# Patient Record
Sex: Male | Born: 1965 | Race: White | Hispanic: No | Marital: Married | Smoking: Never smoker
Health system: Southern US, Community
[De-identification: ages and names within clinical notes are randomized; demographics above are authoritative.]

---

## 2016-11-21 ENCOUNTER — Other Ambulatory Visit: Payer: Self-pay | Admitting: Otolaryngology

## 2016-11-21 DIAGNOSIS — H9041 Sensorineural hearing loss, unilateral, right ear, with unrestricted hearing on the contralateral side: Secondary | ICD-10-CM

## 2016-11-26 ENCOUNTER — Ambulatory Visit
Admission: RE | Admit: 2016-11-26 | Discharge: 2016-11-26 | Disposition: A | Discharge status: Home / Self Care | Payer: BLUE CROSS/BLUE SHIELD | Source: Ambulatory Visit | Attending: Otolaryngology | Admitting: Otolaryngology

## 2016-11-26 DIAGNOSIS — H9041 Sensorineural hearing loss, unilateral, right ear, with unrestricted hearing on the contralateral side: Secondary | ICD-10-CM

## 2016-11-26 MED ORDER — GADOBENATE DIMEGLUMINE 529 MG/ML IV SOLN
20.0000 mL | Freq: Once | INTRAVENOUS | Status: AC | PRN
  Administered 2016-11-26: 20 mL via INTRAVENOUS

## 2017-12-27 IMAGING — MR MR BRAIN/IAC WO/W
15 of 17 series · 40 of 48 positions shown · IV contrast (20 ML multihance)
Comparison: None available.

CLINICAL DATA: Initial evaluation for right-sided hearing loss.

EXAM:
MRI HEAD WITHOUT AND WITH CONTRAST
TECHNIQUE: Multiplanar, multiecho pulse sequences of the brain and surrounding
structures were obtained without and with intravenous contrast. An
IAC protocol was employed.
CONTRAST:  20mL MULTIHANCE GADOBENATE DIMEGLUMINE 529 MG/ML IV SOLN

[Series 2: T1 · sagittal · 5.0mm · 0.47mm/px · 1 of 21 slices shown (1 of 3)]
[im 1/21]
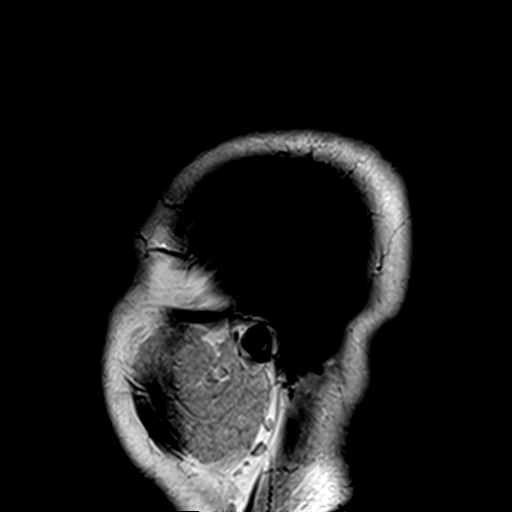

[Series 3: DWI · axial · 3.0mm · 1.80mm/px · z∈[-41,+111]mm · 7 of 104 slices shown (1 of 2)]
[im 1/104]
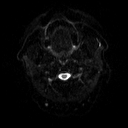
[im 18/104]
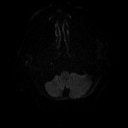
[im 35/104]
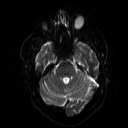
[im 52/104]
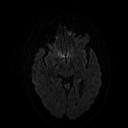
[im 69/104]
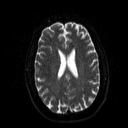
[im 86/104]
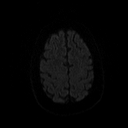
[im 104/104]
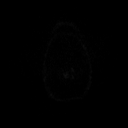

[Series 4: DWI · axial · 3.0mm · 1.80mm/px · z∈[-41,+111]mm · 3 of 52 slices shown (2 of 2)]
[im 1/52]
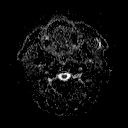
[im 26/52]
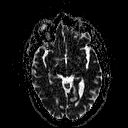
[im 52/52]
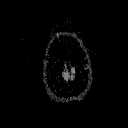

[Series 5: T2 · axial · 5.0mm · 0.51mm/px · z∈[-46,+115]mm · 2 of 25 slices shown (1 of 2)]
[im 1/25]
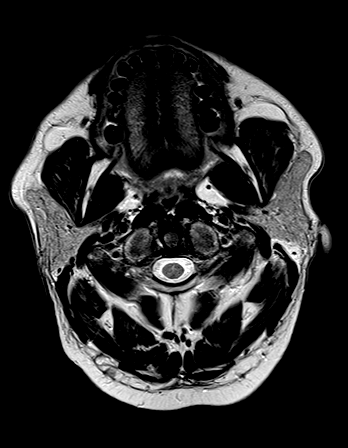
[im 25/25]
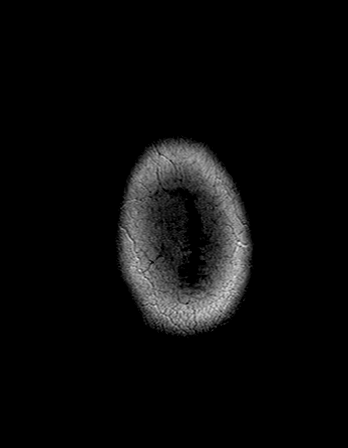

[Series 6: FLAIR · axial · 3.0mm · 0.45mm/px · z∈[-39,+109]mm · 2 of 33 slices shown]
[im 1/33]
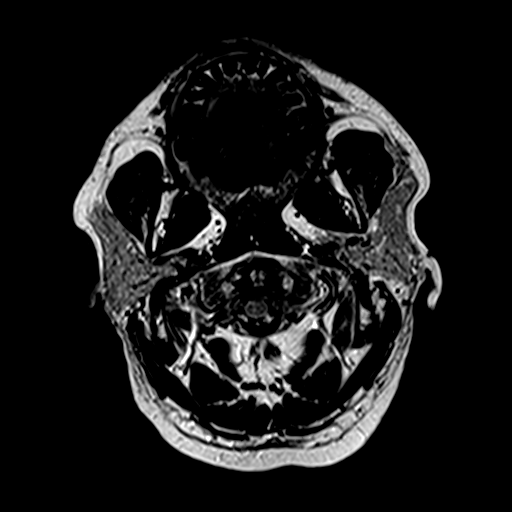
[im 33/33]
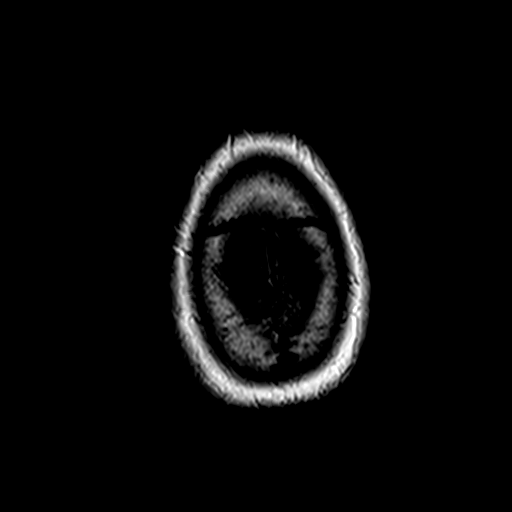

[Series 7: mip_images(sw) · axial · 40.0mm · 0.90mm/px · 1 of 23 slices shown]
[im 1/23]
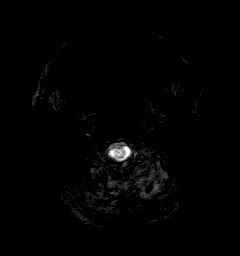

[Series 8: swi_images · axial · 5.0mm · 0.90mm/px · z∈[-37,+107]mm · 2 of 30 slices shown]
[im 1/30]
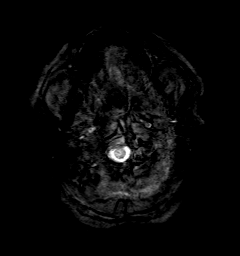
[im 30/30]
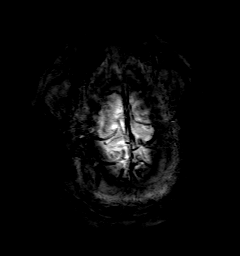

[Series 9: t1_mpr_tra · axial · 1.0mm · 0.75mm/px · z∈[-36,+106]mm · 8 of 144 slices shown (1 of 2)]
[im 1/144]
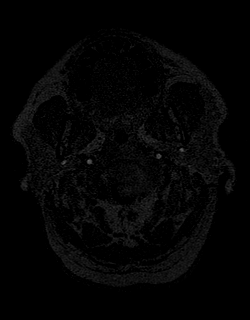
[im 18/144]
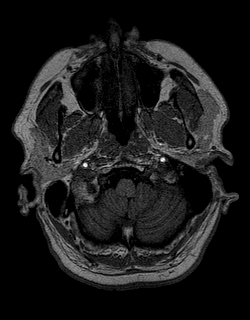
[im 36/144]
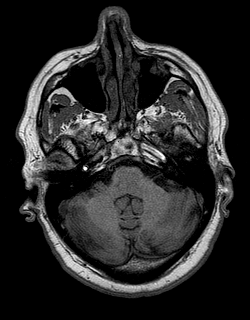
[im 54/144]
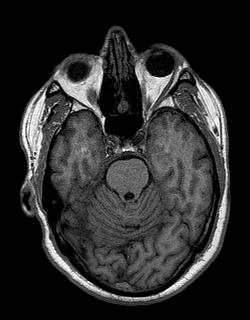
[im 90/144]
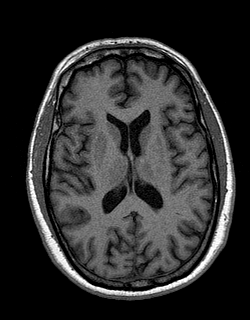
[im 108/144]
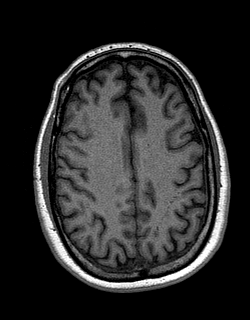
[im 126/144]
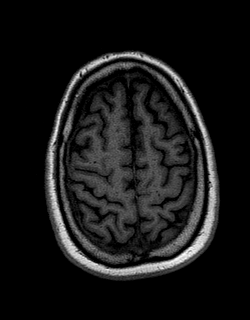
[im 144/144]
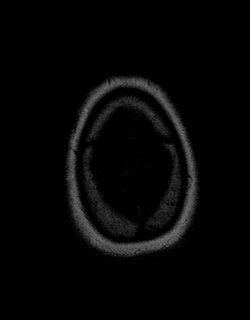

[Series 10: T1 · coronal · 3.0mm · 0.35mm/px · 1 of 14 slices shown (2 of 3)]
[im 1/14]
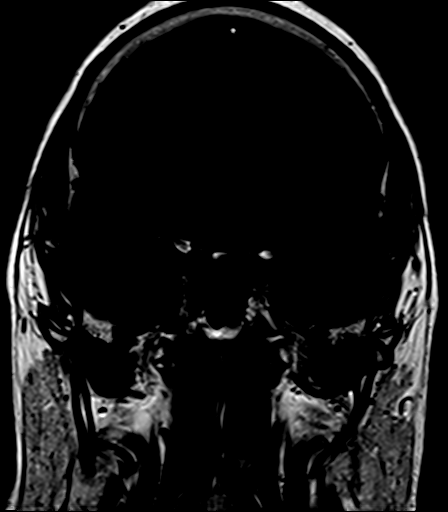

[Series 11: T1 · axial · 3.0mm · 0.35mm/px · 1 of 14 slices shown (3 of 3)]
[im 1/14]
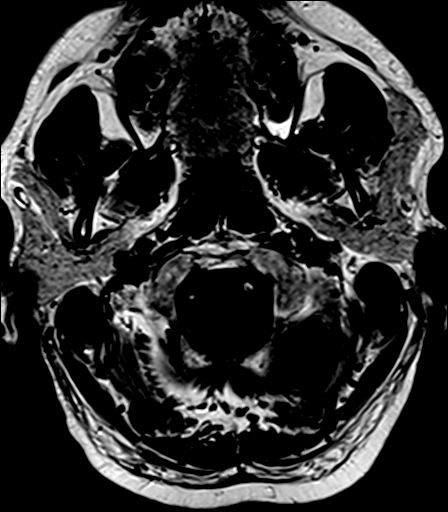

[Series 12: bSSFP · axial · 1.0mm · 0.28mm/px · z∈[-27,+12]mm · 3 of 40 slices shown]
[im 1/40]
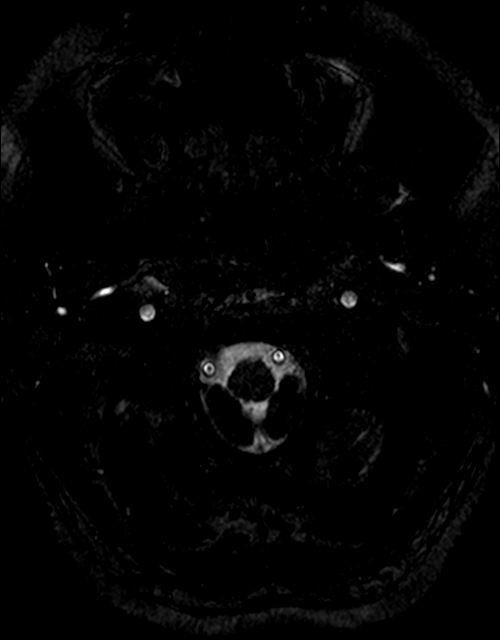
[im 20/40]
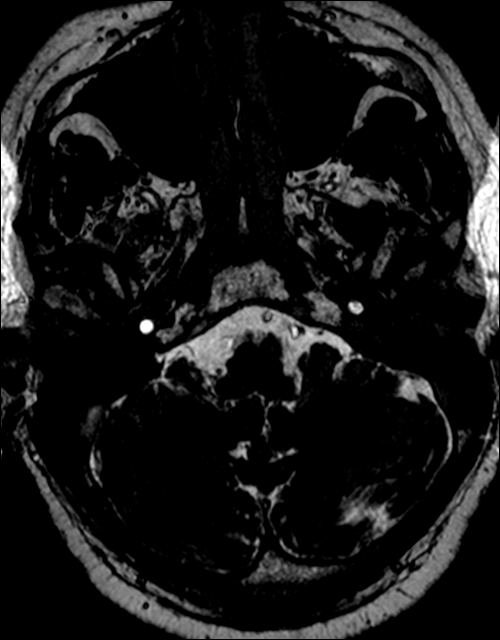
[im 40/40]
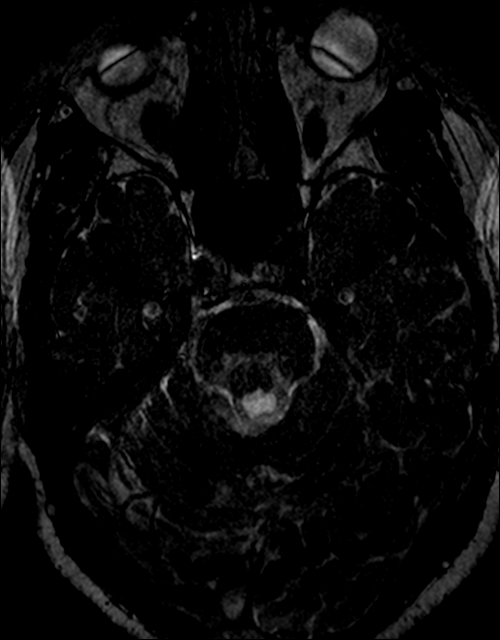

[Series 13: T2 · coronal · 5.0mm · 0.45mm/px · 2 of 25 slices shown (2 of 2)]
[im 1/25]
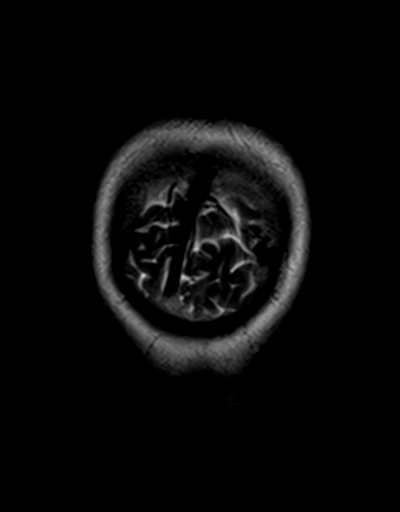
[im 25/25]
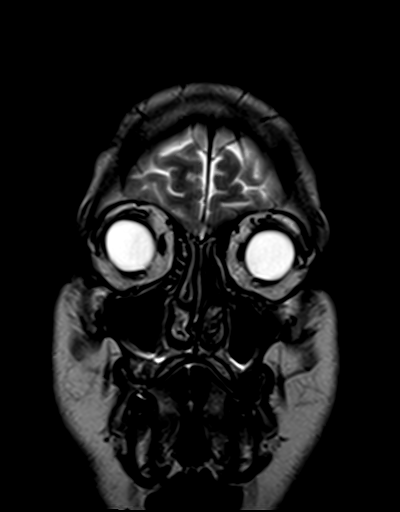

[Series 14: T1 post-contrast · coronal · 3.0mm · 0.35mm/px · 1 of 14 slices shown (1 of 2)]
[im 1/14]
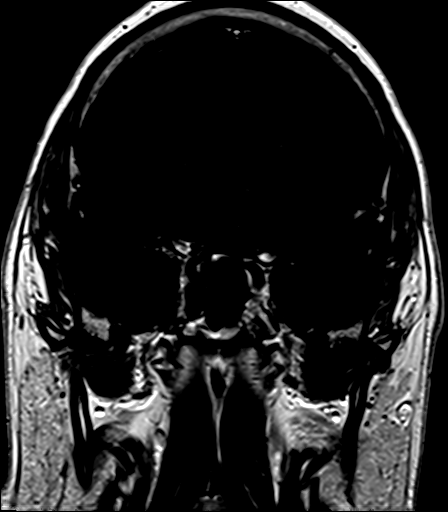

[Series 15: T1 post-contrast · axial · 3.0mm · 0.35mm/px · 1 of 14 slices shown (2 of 2)]
[im 1/14]
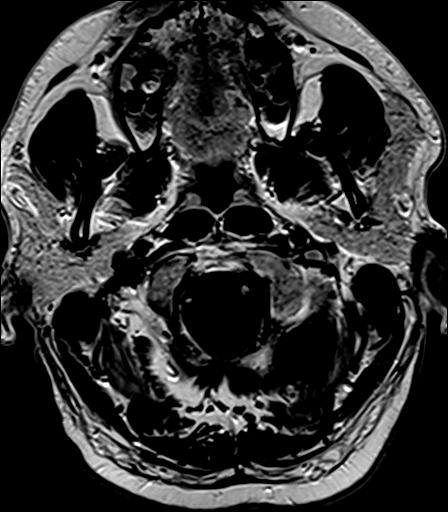

[Series 16: t1_mpr_tra · axial · 1.0mm · 0.75mm/px · z∈[-36,+52]mm · 5 of 144 slices shown (2 of 2)]
[im 1/144]
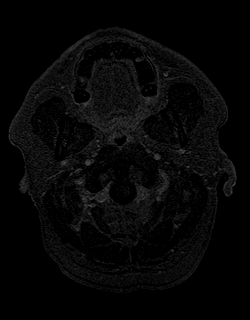
[im 18/144]
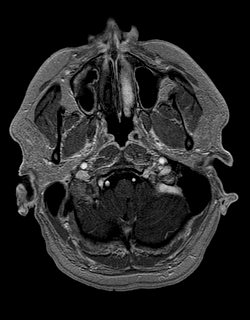
[im 36/144]
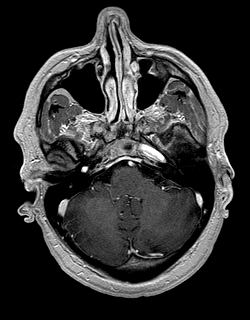
[im 54/144]
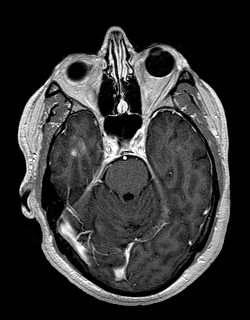
[im 90/144]
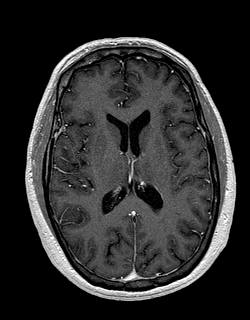

[40 of 48 positions shown; findings below may reference images not displayed]

FINDINGS: Brain: Cerebral volume within normal limits for age. No focal
parenchymal signal abnormality identified. No significant cerebral
white matter disease for age.

No abnormal foci of restricted diffusion to suggest acute or
subacute ischemia. Gray-white matter differentiation well
maintained. No evidence for chronic infarction. No acute or chronic
intracranial hemorrhage.

No mass lesion, midline shift or mass effect. Ventricles normal in
size without evidence for hydrocephalus. No extra-axial fluid
collection. Major dural sinuses are patent.

Pituitary gland and suprasellar region within normal limits. Midline
structures intact and normal.

Thin section imaging through the internal auditory canals was
performed. No mass lesions seen at the cerebellopontine angles.
Seventh and eighth cranial nerves seen coursing normally into the
internal auditory canals. Inner ear structures including the
vestibules, cochlea, and semi circular canals are normal. Normal
post ganglionic enhancement seen within the seventh cranial nerves
bilaterally. No mass lesion or abnormal enhancement identified. No
mastoid effusion.

Vascular: Major intracranial vascular flow voids are well maintained
and are normal.

Skull and upper cervical spine: Craniocervical junction within
normal limits. Visualized upper cervical spine unremarkable. Bone
marrow signal intensity within normal limits. No scalp soft tissue
abnormality.

Sinuses/Orbits: Globes orbital soft tissues within normal limits.
Mild scattered mucosal thickening within the left sphenoid sinus and
maxillary sinuses. Paranasal sinuses are otherwise clear.
IMPRESSION: Normal IAC protocol MRI of the brain. No structural findings to
explain patient's symptoms identified.

## 2019-11-29 ENCOUNTER — Other Ambulatory Visit (INDEPENDENT_AMBULATORY_CARE_PROVIDER_SITE_OTHER): Payer: Self-pay | Admitting: Otolaryngology

## 2020-05-19 ENCOUNTER — Ambulatory Visit (INDEPENDENT_AMBULATORY_CARE_PROVIDER_SITE_OTHER): Payer: BC Managed Care – PPO | Admitting: Otolaryngology

## 2020-05-19 ENCOUNTER — Encounter (INDEPENDENT_AMBULATORY_CARE_PROVIDER_SITE_OTHER): Payer: Self-pay | Admitting: Otolaryngology

## 2020-05-19 ENCOUNTER — Other Ambulatory Visit: Payer: Self-pay

## 2020-05-19 VITALS — Temp 97.3°F

## 2020-05-19 DIAGNOSIS — H8101 Meniere's disease, right ear: Secondary | ICD-10-CM

## 2020-05-19 NOTE — Progress Notes (Signed)
HPI: Todd Boyd is a 55 y.o. 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  This is fairly well controlled with low-salt diet and diuretic.  He tried coming off the diuretic but the dizzy episodes became little bit worse and he is back on the diuretic.  He uses meclizine when he has the dizzy spells.  He has noticed a little more decline in hearing in his right ear and has an aide for the right ear but apparently this was damaged by their dog and he is looking to get a new hearing aid.  He needs refills of his Dyazide and meclizine..  No past medical history on file.  Social History   Socioeconomic History  . Marital status: Married    Spouse name: Not on file  . Number of children: Not on file  . Years of education: Not on file  . Highest education level: Not on file  Occupational History  . Not on file  Tobacco Use  . Smoking status: Never Smoker  . Smokeless tobacco: Former Neurosurgeon  . Tobacco comment: chewed off and on for 3 to 4 years  Substance and Sexual Activity  . Alcohol use: Not on file  . Drug use: Not on file  . Sexual activity: Not on file  Other Topics Concern  . Not on file  Social History Narrative  . Not on file   Social Determinants of Health   Financial Resource Strain: Not on file  Food Insecurity: Not on file  Transportation Needs: Not on file  Physical Activity: Not on file  Stress: Not on file  Social Connections: Not on file   No family history on file. No Known Allergies Prior to Admission medications   Medication Sig Start Date End Date Taking? Authorizing Provider  triamterene-hydrochlorothiazide (MAXZIDE-25) 37.5-25 MG tablet TAKE 2 TABLETS BY MOUTH EVERY DAY 03/16/20   Drema Halon, MD     Positive ROS: Otherwise negative  All other systems have been reviewed and were otherwise negative with the exception of those mentioned in the HPI and as above.  Physical Exam: Constitutional: Alert, well-appearing,  no acute distress Ears: External ears without lesions or tenderness. Ear canals are clear bilaterally with intact, clear TMs bilaterally. Nasal: External nose without lesions. Clear nasal passages Oral: Lips and gums without lesions. Tongue and palate mucosa without lesions. Posterior oropharynx clear. Neck: No palpable adenopathy or masses Respiratory: Breathing comfortably  Skin: No facial/neck lesions or rash noted.  Procedures  Assessment: History of right-sided Mnire's disease with hearing loss.  His dizzy episodes are doing better.  Plan: Refilled his Dyazide 1 daily a.m. for 90 and 4 refills. Also refilled his meclizine 25 mg 1 every 8 hours as needed dizzy #40 with 1 refill. He will follow-up in 1 year for recheck.  Earlier if he has any problems.   Narda Bonds, MD

## 2020-11-24 ENCOUNTER — Other Ambulatory Visit (INDEPENDENT_AMBULATORY_CARE_PROVIDER_SITE_OTHER): Payer: Self-pay | Admitting: Otolaryngology

## 2020-11-24 ENCOUNTER — Telehealth (INDEPENDENT_AMBULATORY_CARE_PROVIDER_SITE_OTHER): Payer: Self-pay | Admitting: Otolaryngology

## 2020-11-24 MED ORDER — TRIAMTERENE-HCTZ 37.5-25 MG PO CAPS: 1.0000 | ORAL_CAPSULE | Freq: Every day | ORAL | 3 refills | Status: AC

## 2020-11-24 NOTE — Telephone Encounter (Signed)
Patient called about refill of his Dyazide called into [facility].

## 2023-05-16 ENCOUNTER — Encounter: Payer: Self-pay | Admitting: Family Medicine

## 2023-05-16 ENCOUNTER — Ambulatory Visit: Payer: BC Managed Care – PPO | Admitting: Family Medicine

## 2023-05-16 VITALS — BP 144/72 | HR 80 | Temp 99.1°F | Ht 71.0 in | Wt 199.0 lb

## 2023-05-16 DIAGNOSIS — Z125 Encounter for screening for malignant neoplasm of prostate: Secondary | ICD-10-CM

## 2023-05-16 DIAGNOSIS — Z1159 Encounter for screening for other viral diseases: Secondary | ICD-10-CM

## 2023-05-16 DIAGNOSIS — M1A09X0 Idiopathic chronic gout, multiple sites, without tophus (tophi): Secondary | ICD-10-CM | POA: Diagnosis not present

## 2023-05-16 DIAGNOSIS — R0683 Snoring: Secondary | ICD-10-CM | POA: Diagnosis not present

## 2023-05-16 DIAGNOSIS — H8101 Meniere's disease, right ear: Secondary | ICD-10-CM

## 2023-05-16 DIAGNOSIS — E663 Overweight: Secondary | ICD-10-CM

## 2023-05-16 DIAGNOSIS — Z7689 Persons encountering health services in other specified circumstances: Secondary | ICD-10-CM

## 2023-05-16 DIAGNOSIS — R030 Elevated blood-pressure reading, without diagnosis of hypertension: Secondary | ICD-10-CM | POA: Insufficient documentation

## 2023-05-16 DIAGNOSIS — Z1211 Encounter for screening for malignant neoplasm of colon: Secondary | ICD-10-CM

## 2023-05-16 DIAGNOSIS — Z1212 Encounter for screening for malignant neoplasm of rectum: Secondary | ICD-10-CM

## 2023-05-16 HISTORY — DX: Idiopathic chronic gout, multiple sites, without tophus (tophi): M1A.09X0

## 2023-05-16 HISTORY — DX: Meniere's disease, right ear: H81.01

## 2023-05-16 MED ORDER — COLCHICINE 0.6 MG PO TABS: ORAL_TABLET | ORAL | 0 refills | Status: AC

## 2023-05-16 NOTE — Assessment & Plan Note (Signed)
 1-2 gout flares a year.  Uric acid level ordered today.  Colchicine prescription provided.

## 2023-05-16 NOTE — Patient Instructions (Signed)
 It was nice to see you today,  We addressed the following topics today: -Your blood pressure was a little bit high again.  I would like you to check it once or twice a day for a few weeks and document these values.  When I see you again in 1 month we can discuss your home readings and compare them to what we get in the office. - I have ordered some blood tests and we will follow-up with results with you when I get them. - I have sent in a order for the Cologuard.  Someone will mail it to you.  Instructions on what to do and how to mail back will be included on the box - I have provided you a prescription for colchicine .  Take this as needed for gout flares.  Have a great day,  Rolan Slain, MD

## 2023-05-16 NOTE — Assessment & Plan Note (Signed)
 Sees ENT.  Takes triamterene-HCTZ.  Has some hearing loss due to this, more so in the right ear.

## 2023-05-16 NOTE — Progress Notes (Signed)
 New Patient Office Visit  Subjective   Patient ID: Todd Boyd, male    DOB: November 01, 1965  Age: 58 y.o. MRN: [id number]  CC:  Chief Complaint  Patient presents with   Establish Care    HPI Todd Boyd presents to establish care  Patient did not have a primary care provider prior to this.  He has been seeing an ear nose and throat doctor for Mnire's disease.  Patient takes triamterene -HCTZ.  Also takes over-the-counter Benadryl every night for allergies.  Will occasionally get gout flares once or twice a year and usually takes a friend's colchicine   Patient also complains of sleep apnea.  States he has been told he has apneic episodes and snoring.  Does not have mid day fatigue, no headaches.  Does have elevated blood pressure.  Patient agreeable to doing the Cologuard for colon cancer screening.  PMH: Mnire's-right-sided.  Gout, allergies  PSH: No  FH: No  Tobacco use: dipped for 5 years.  Stopped a few months ago.   Alcohol use: socially.   Drug use: no Marital status: married.  Daughter.  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    Screenings:  Colon Cancer: Never done.   Prostate cancer: Needs PSA   Outpatient Encounter Medications as of 05/16/2023  Medication Sig   colchicine  0.6 MG tablet Take 2 tabs (1.2 mg) by mouth, then 0.6 mg 1 hr later on first day. Starting day 2, take 1 tablet (0.6 mg total) by mouth once or twice daily until gout flare resolves.   triamterene -hydrochlorothiazide (DYAZIDE) 37.5-25 MG capsule Take 1 each (1 capsule total) by mouth daily.   triamterene -hydrochlorothiazide (MAXZIDE-25) 37.5-25 MG tablet TAKE 2 TABLETS BY MOUTH EVERY DAY (Patient not taking: Reported on 05/16/2023)   No facility-administered encounter medications on file as of 05/16/2023.    Past Medical History:  Diagnosis Date   Idiopathic chronic gout of multiple sites without tophus 05/16/2023   Meniere's disease of right ear 05/16/2023     History reviewed. No pertinent surgical history.  History reviewed. No pertinent family history.  Social History   Socioeconomic History   Marital status: Married    Spouse name: Not on file   Number of children: Not on file   Years of education: Not on file   Highest education level: Not on file  Occupational History   Not on file  Tobacco Use   Smoking status: Never   Smokeless tobacco: Former   Tobacco comments:    chewed off and on for 3 to 4 years  Substance and Sexual Activity   Alcohol use: Yes    Alcohol/week: 1.0 standard drink of alcohol    Types: 1 Standard drinks or equivalent per week   Drug use: Never   Sexual activity: Not on file  Other Topics Concern   Not on file  Social History Narrative   Not on file   Social Drivers of Health   Financial Resource Strain: Not on file  Food Insecurity: Not on file  Transportation Needs: Not on file  Physical Activity: Not on file  Stress: Not on file  Social Connections: Not on file  Intimate Partner Violence: Not on file    ROS     Objective   BP (!) 144/72   Pulse 80   Temp 99.1 F (37.3 C) (Oral)   Ht 5' 11 (1.803 m)   Wt 199 lb (90.3 kg)   SpO2 100%   BMI  27.75 kg/m   Physical Exam General: Alert, oriented HEENT: PERRLA, EOMI, moist mucosa CV: Regular rate and rhythm Pulmonary: Lungs clear bilaterally GI: Soft, nontender.  Normal bowel sounds MSK: Strength equal bilaterally, normal gait Extremities: No pedal edema Psych: Pleasant affect     Assessment & Plan:   Encounter to establish care  Idiopathic chronic gout of multiple sites without tophus Assessment & Plan: 1-2 gout flares a year.  Uric acid level ordered today.  Colchicine  prescription provided.  Orders: -     CBC -     Uric acid  Meniere's disease of right ear Assessment & Plan: Sees ENT.  Takes triamterene -HCTZ.  Has some hearing loss due to this, more so in the right ear.  Orders: -     Comprehensive  metabolic panel  Encounter for colorectal cancer screening -     Cologuard  Need for hepatitis C screening test -     Hepatitis C antibody  Prostate cancer screening -     PSA  Overweight (BMI 25.0-29.9) -     Hemoglobin A1c -     Lipid panel  Snoring Assessment & Plan: Complains of snoring and apneic episodes.  Has hypertension.  STOP-BANG score 5, high risk.  Sleep study ordered.  Orders: -     PSG Sleep Study; Future  Elevated blood pressure reading Assessment & Plan: Elevated on 2 readings today.  He is on blood pressure medication for Mnire's.  Advised to check home readings and follow-up in 1 month.  If still elevated or if home readings are elevated can discuss additional medications.   Other orders -     Colchicine ; Take 2 tabs (1.2 mg) by mouth, then 0.6 mg 1 hr later on first day. Starting day 2, take 1 tablet (0.6 mg total) by mouth once or twice daily until gout flare resolves.  Dispense: 30 tablet; Refill: 0    Return in about 4 weeks (around 06/13/2023) for HTN.   [name], MD

## 2023-05-16 NOTE — Assessment & Plan Note (Signed)
 Complains of snoring and apneic episodes.  Has hypertension.  STOP-BANG score 5, high risk.  Sleep study ordered.

## 2023-05-16 NOTE — Assessment & Plan Note (Signed)
 Elevated on 2 readings today.  He is on blood pressure medication for Mnire's.  Advised to check home readings and follow-up in 1 month.  If still elevated or if home readings are elevated can discuss additional medications.

## 2023-05-18 LAB — CBC
Hematocrit: 54.3 % — ABNORMAL HIGH (ref 37.5–51.0)
Hemoglobin: 18.2 g/dL — ABNORMAL HIGH (ref 13.0–17.7)
MCH: 31.1 pg (ref 26.6–33.0)
MCHC: 33.5 g/dL (ref 31.5–35.7)
MCV: 93 fL (ref 79–97)
Platelets: 341 10*3/uL (ref 150–450)
RBC: 5.85 x10E6/uL — ABNORMAL HIGH (ref 4.14–5.80)
RDW: 12.5 % (ref 11.6–15.4)
WBC: 7.2 10*3/uL (ref 3.4–10.8)

## 2023-05-18 LAB — LIPID PANEL
Chol/HDL Ratio: 6.2 {ratio} — ABNORMAL HIGH (ref 0.0–5.0)
Cholesterol, Total: 296 mg/dL — ABNORMAL HIGH (ref 100–199)
HDL: 48 mg/dL (ref >39)
LDL Chol Calc (NIH): 199 mg/dL — ABNORMAL HIGH (ref 0–99)
Triglycerides: 248 mg/dL — ABNORMAL HIGH (ref 0–149)
VLDL Cholesterol Cal: 49 mg/dL — ABNORMAL HIGH (ref 5–40)

## 2023-05-18 LAB — COMPREHENSIVE METABOLIC PANEL
ALT: 71 [IU]/L — ABNORMAL HIGH (ref 0–44)
AST: 66 [IU]/L — ABNORMAL HIGH (ref 0–40)
Albumin: 5.1 g/dL — ABNORMAL HIGH (ref 3.8–4.9)
Alkaline Phosphatase: 110 [IU]/L (ref 44–121)
BUN/Creatinine Ratio: 10 (ref 9–20)
BUN: 12 mg/dL (ref 6–24)
Bilirubin Total: 0.8 mg/dL (ref 0.0–1.2)
CO2: 22 mmol/L (ref 20–29)
Calcium: 11 mg/dL — ABNORMAL HIGH (ref 8.7–10.2)
Chloride: 95 mmol/L — ABNORMAL LOW (ref 96–106)
Creatinine, Ser: 1.17 mg/dL (ref 0.76–1.27)
Globulin, Total: 3.5 g/dL (ref 1.5–4.5)
Glucose: 121 mg/dL — ABNORMAL HIGH (ref 70–99)
Potassium: 3.8 mmol/L (ref 3.5–5.2)
Sodium: 139 mmol/L (ref 134–144)
Total Protein: 8.6 g/dL — ABNORMAL HIGH (ref 6.0–8.5)
eGFR: 72 mL/min/{1.73_m2} (ref >59)

## 2023-05-18 LAB — HEMOGLOBIN A1C
Est. average glucose Bld gHb Est-mCnc: 134 mg/dL
Hgb A1c MFr Bld: 6.3 % — ABNORMAL HIGH (ref 4.8–5.6)

## 2023-05-18 LAB — URIC ACID: Uric Acid: 7.4 mg/dL (ref 3.8–8.4)

## 2023-05-18 LAB — PSA: Prostate Specific Ag, Serum: 0.9 ng/mL (ref 0.0–4.0)

## 2023-05-18 LAB — HEPATITIS C ANTIBODY

## 2023-05-20 ENCOUNTER — Telehealth: Payer: Self-pay

## 2023-05-20 NOTE — Telephone Encounter (Signed)
 Please give this information and schedule accordingly:  Hemoglobin elevated, possibly due to sleep apnea.  Calcium elevated.  AST and ALT elevated.  A1c in the high prediabetic range.  LDL cholesterol greater than 190, total cholesterol 296.   Please call the patient to tell him that his cholesterol level is significantly elevated, to the point that it is recommended that he start a statin medication.  His A1c showed that he is prediabetic.  He has several other abnormal lab findings that I would like to discussed with him at the office.  Please have him schedule a follow-up appointment with me in the next 3 to 4 weeks.  He was supposed to schedule a follow-up appointment a month from now for blood pressure recheck as well.

## 2023-07-15 LAB — COLOGUARD: COLOGUARD: NEGATIVE

## 2023-07-16 ENCOUNTER — Telehealth: Payer: Self-pay

## 2023-07-16 NOTE — Telephone Encounter (Signed)
 LVM for pt to return call for results and to schedule a follow up appt.

## 2023-07-16 NOTE — Telephone Encounter (Signed)
-----   Message from Sandre Kitty sent at 07/15/2023  5:39 PM EDT ----- Cologuard was negative.    Please attempt to call the patient again to inform him of his results including the previous lab results and to have him schedule a follow-up visit with me.

## 2023-08-05 ENCOUNTER — Ambulatory Visit (HOSPITAL_BASED_OUTPATIENT_CLINIC_OR_DEPARTMENT_OTHER): Payer: BC Managed Care – PPO | Attending: Family Medicine | Admitting: Internal Medicine

## 2023-08-05 DIAGNOSIS — G4733 Obstructive sleep apnea (adult) (pediatric): Secondary | ICD-10-CM | POA: Insufficient documentation

## 2023-08-05 DIAGNOSIS — R0683 Snoring: Secondary | ICD-10-CM | POA: Diagnosis present

## 2023-08-11 DIAGNOSIS — R0683 Snoring: Secondary | ICD-10-CM | POA: Diagnosis not present

## 2023-08-11 NOTE — Procedures (Signed)
 [facility] [address] [place], [place] [address] Tel: [phone]   Fax: [phone]  Polysomnography Interpretation  Patient Name:  Todd Boyd, Todd Boyd Date:  08/05/2023 Referring Physician:  Dr. Lenor Coffin  Indications for Polysomnography The patient is a 58 year old Male who is 5\' 10"  and weighs 182.0 lbs. His BMI equals 26.4.  A full night polysomnogram was performed to evaluate for -.  Medication were taken at 10:00 pm.  Benadryl   Polysomnogram Data A full night polysomnogram recorded the standard physiologic parameters including EEG, EOG, EMG, EKG, nasal and oral airflow.  Respiratory parameters of chest and abdominal movements were recorded with Respiratory Inductance Plethysmography belts.  Oxygen saturation was recorded by pulse oximetry.   Sleep Architecture The total recording time of the polysomnogram was 396.9 minutes.  The total sleep time was 354.5 minutes.  The patient spent 21.0% of total sleep time in Stage N1, 61.4% in Stage N2, 0.0% in Stages N3, and 17.6% in REM.  Sleep latency was 6.5 minutes.  REM latency was 110.5 minutes.  Sleep Efficiency was 89.3%.  Wake after Sleep Onset time was 35.5 minutes.  Respiratory Events The polysomnogram revealed a presence of 49 obstructive, 4 centrals, and - mixed apneas resulting in an Apnea index of 9.0 events per hour.  There were 110 hypopneas (>=3% desaturation and/or arousal) resulting in an Apnea\Hypopnea Index (AHI >=3% desaturation and/or arousal) of 27.6 events per hour.  There were 79 hypopneas (>=4% desaturation) resulting in an Apnea\Hypopnea Index (AHI >=4% desaturation) of 22.3 events per hour.  There were 39 Respiratory Effort Related Arousals resulting in a RERA index of 6.6 events per hour. The Respiratory Disturbance Index is 34.2 events per hour.  The snore index was 112.7 events per hour.  Mean oxygen saturation was 93.8%.  The lowest oxygen saturation during  sleep was 83.0%.  Time spent <=88% oxygen saturation was 5.6 minutes (1.4%).  End Tidal CO2 during sleep ranged from - to - mmHg. End Tidal CO2 was greater than 50 mmHg for - minutes and greater than 55 mmHg for - minutes.  Limb Activity There were - total limb movements recorded, of this total, - were classified as PLMs.  PLM index was - per hour and PLM associated with Arousals index was - per hour.  Cardiac Summary The average pulse rate was 63.7 bpm.  The minimum pulse rate was 52.0 bpm while the maximum pulse rate was 84.0 bpm.  Cardiac rhythm was normal.  Comment: Moderate obstructive sleep apnea, AHI (4%) 22.3/hr. Snoring with oxygen desaturation to a nadir of 83% and mean 93.8%.  Diagnosis: Obstructive sleep apnea  Recommendations: Suggest CPAP titration sleep study or autopap. Other options would be based on clinical judgment.   This study was personally reviewed and electronically signed by: Dr. Jetty Duhamel Accredited Board Certified in Sleep Medicine Date/Time: 08/11/23   12:33        Diagnostic PSG Report  Patient Name: Todd Boyd, Todd Boyd Date: 08/05/2023  Date of Birth: Dec 15, 1965 Study Type: Diagnostic  Age: 58 year MRN #: [id number]  Sex: Male Interpreting Physician: Armanda Magic [id number]  Height: 5\' 10"  Referring Physician: Dr. Lenor Coffin  Weight: 182.0 lbs Recording Tech: Ulyess Mort RRT RPSGT RST  BMI: 26.4 Scoring Tech: Ulyess Mort RRT RPSGT RST  ESS: 6 Neck Size: 15.5   Study Overview  Lights Off: 10:23:50 PM  Count Index  Lights On: 05:00:46 AM Awakenings: 35 5.9  Time  in Bed: 396.9 min. Arousals: 197 33.3  Total Sleep Time: 354.5 min. AHI (>=3% Desat and/or Ar.): 163 27.6   Sleep Efficiency: 89.3% AHI (>=4% Desat): 132 22.3   Sleep Latency: 6.5 min. Limb Movements: - -  Wake After Sleep Onset: 35.5 min. Snore: 666 112.7  REM Latency from Sleep Onset: 110.5 min. Desaturations: 150 25.4     Minimum SpO2 TST: 83.0%    Sleep  Architecture  % of Time in Bed Stages Time (mins) % Sleep Time  Wake 42.5   Stage N1 74.5 21.0%  Stage N2 217.5 61.4%  Stage N3 0.0 0.0%  REM 62.5 17.6%   Arousal Summary   NREM REM Sleep Index  Respiratory Arousals 161 4 165 27.9  PLM Arousals - - - -  Isolated Limb Movement Arousals - - - -  Snore Arousals 5 - 5 0.8  Spontaneous Arousals 25 2 27  4.6  Total 191 6 197 33.3   Limb Movement Summary   Count Index  Isolated Limb Movements - -  Periodic Limb Movements (PLMs) - -  Total Limb Movements - -  Respiratory Summary   By Sleep Stage By Body Position Total   NREM REM Supine Non-Supine   Time (min) 292.0 62.5 164.5 190.0 354.5         Obstructive Apnea 48 1 49 - 49  Mixed Apnea - - - - -  Central Apnea 4 - 4 - 4  Total Apneas 52 1 53 - 53  Total Apnea Index 10.7 1.0 19.3 - 9.0         Hypopneas (>=3% Desat and/or Ar.) 106 4 104 6 110  AHI (>=3% Desat and/or Ar.) 32.5 4.8 57.3 1.9 27.6         Hypopneas (>=4% Desat) 79 - 79 - 79  AHI (>=4% Desat) 26.9 1.0 48.1 - 22.3          RERAs 37 2 23 16  39  RERA Index 7.6 1.9 8.4 5.1 6.6         RDI 40.1 6.7 65.7 6.9 34.2    Respiratory Event Type Index  Central Apneas 0.7  Obstructive Apneas 8.3  Mixed Apneas -  Central Hypopneas 0.2  Obstructive Hypopneas 19.0  Central Apnea + Hypopnea (CAHI) 0.8  Obstructive Apnea + Hypopnea (OAHI) 27.2   Respiratory Event Durations   Apnea Hypopnea   NREM REM NREM REM  Average (seconds) 36.1 45.9 37.6 23.4  Maximum (seconds) 62.1 45.9 74.9 29.5    Oxygen Saturation Summary   Wake NREM REM TST TIB  Average SpO2 (%) 94.2% 93.7% 93.9% 93.7% 93.8%  Minimum SpO2 (%) 83.0% 83.0% 91.0% 83.0% 83.0%  Maximum SpO2 (%) 99.0% 98.0% 96.0% 98.0% 99.0%   Oxygen Saturation Distribution  Range (%) Time in range (min) Time in range (%)  90.0 - 100.0 374.4 94.5%  80.0 - 90.0 21.9 5.5%  70.0 - 80.0 - -  60.0 - 70.0 - -  50.0 - 60.0 - -  0.0 - 50.0 - -  Time Spent <=88%  SpO2  Range (%) Time in range (min) Time in range (%)  0.0 - 88.0 5.6 1.4%      Count Index  Desaturations 150 25.4    Cardiac Summary   Wake NREM REM Sleep Total  Average Pulse Rate (BPM) 64.5 62.7 67.9 63.6 63.7  Minimum Pulse Rate (BPM) 53.0 52.0 53.0 52.0 52.0  Maximum Pulse Rate (BPM) 84.0 78.0 80.0 80.0 84.0   Pulse Rate Distribution:  Range (bpm) Time in range (min) Time in range (%)  0.0 - 40.0 - -  40.0 - 60.0 112.7 28.4%  60.0 - 80.0 280.9 70.9%  80.0 - 100.0 0.2 0.0%  100.0 - 120.0 - -  120.0 - 140.0 - -  140.0 - 200.0 - -   EtCO2 Summary  Stage Min (mmHg) Average (mmHg) Max (mmHg)  Wake - - -  NREM(1+2+3) - - -  REM - - -   EtCO2 Distribution:  Range (mmHg) Time in range (min) Time in range (%)  20.0 - 40.0 - -  40.0 - 50.0 - -  50.0 - 100.0 - -  55.0 - 100.0 - -  Excluded data <20.0 & >65.0 397.0 100.0%     Hypnograms                         Technologist Comments  -  Patient was ordered as a NPSG. Patient is a 58 year old white male who was sent to the sleep center for OSA. No oxygen was applied. Patient reported taking his medication at 10:00 pm. No Plm's/Plma's were noted. No obvious cardiac arrhythmias were noted. No restroom visit was noted.                       Jetty Duhamel Diplomate, Biomedical engineer of Sleep Medicine  ELECTRONICALLY SIGNED ON:  08/11/2023, 12:29 PM [facility] PH: [phone]   FX: [phone] ACCREDITED BY THE [employer]ICAN ACADEMY OF SLEEP MEDICINE
